# Patient Record
Sex: Male | Born: 1980 | Race: Black or African American | Hispanic: No | Marital: Single | Smoking: Never smoker
Health system: Southern US, Community
[De-identification: ages and names within clinical notes are randomized; demographics above are authoritative.]

## PROBLEM LIST (undated history)

## (undated) DIAGNOSIS — I10 Essential (primary) hypertension: Secondary | ICD-10-CM

## (undated) DIAGNOSIS — J189 Pneumonia, unspecified organism: Secondary | ICD-10-CM

## (undated) HISTORY — PX: ANKLE SURGERY: SHX546

## (undated) HISTORY — PX: NECK SURGERY: SHX720

---

## 2006-07-25 ENCOUNTER — Inpatient Hospital Stay (HOSPITAL_COMMUNITY): Admission: AC | Admit: 2006-07-25 | Discharge: 2006-08-17 | Payer: Self-pay

## 2006-08-06 ENCOUNTER — Encounter: Payer: Self-pay | Admitting: Vascular Surgery

## 2006-08-08 ENCOUNTER — Ambulatory Visit: Payer: Self-pay | Admitting: Physical Medicine & Rehabilitation

## 2006-08-17 ENCOUNTER — Inpatient Hospital Stay (HOSPITAL_COMMUNITY)
Admission: RE | Admit: 2006-08-17 | Discharge: 2006-08-31 | Payer: Self-pay | Admitting: Physical Medicine & Rehabilitation

## 2006-09-04 ENCOUNTER — Encounter
Admission: RE | Admit: 2006-09-04 | Discharge: 2006-12-03 | Payer: Self-pay | Admitting: Physical Medicine & Rehabilitation

## 2006-09-14 ENCOUNTER — Ambulatory Visit: Payer: Self-pay | Admitting: Physical Medicine & Rehabilitation

## 2006-09-14 ENCOUNTER — Encounter
Admission: RE | Admit: 2006-09-14 | Discharge: 2006-12-13 | Payer: Self-pay | Admitting: Physical Medicine & Rehabilitation

## 2006-09-18 ENCOUNTER — Encounter: Admission: RE | Admit: 2006-09-18 | Discharge: 2006-09-18 | Payer: Self-pay | Admitting: Neurological Surgery

## 2006-10-30 ENCOUNTER — Encounter: Admission: RE | Admit: 2006-10-30 | Discharge: 2006-10-30 | Payer: Self-pay | Admitting: Neurological Surgery

## 2006-12-14 ENCOUNTER — Encounter: Admission: RE | Admit: 2006-12-14 | Discharge: 2006-12-14 | Payer: Self-pay | Admitting: Neurological Surgery

## 2006-12-31 ENCOUNTER — Encounter: Admission: RE | Admit: 2006-12-31 | Discharge: 2006-12-31 | Payer: Self-pay | Admitting: Neurological Surgery

## 2007-01-23 ENCOUNTER — Ambulatory Visit (HOSPITAL_COMMUNITY): Admission: RE | Admit: 2007-01-23 | Discharge: 2007-01-24 | Payer: Self-pay | Admitting: Anesthesiology

## 2007-02-25 ENCOUNTER — Encounter: Admission: RE | Admit: 2007-02-25 | Discharge: 2007-02-25 | Payer: Self-pay | Admitting: Neurological Surgery

## 2007-04-29 ENCOUNTER — Encounter: Admission: RE | Admit: 2007-04-29 | Discharge: 2007-04-29 | Payer: Self-pay | Admitting: Neurological Surgery

## 2007-11-02 IMAGING — CT CT HEAD W/O CM
2 of 3 series · 16 of 30 positions shown, 19 images · IV contrast (agent unspecified)
Comparison: 07/31/06.

CLINICAL DATA: Closed head injury, intracranial contusions and tiny right parafalcine subdural hematoma.
HEAD CT WITHOUT CONTRAST:
TECHNIQUE: Contiguous axial images were obtained from the base of the skull through the vertex according to standard protocol without contrast.

[Series 2: head routine 4.8 h47s · axial · 0.47mm/px · z∈[-160,-15]mm · 12 of 36 slices shown, 15 images (1 of 2)]
[im 3/36  brain]
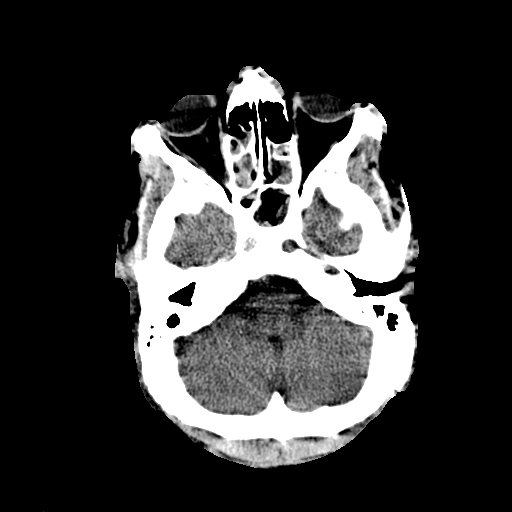
[im 3/36  bone]
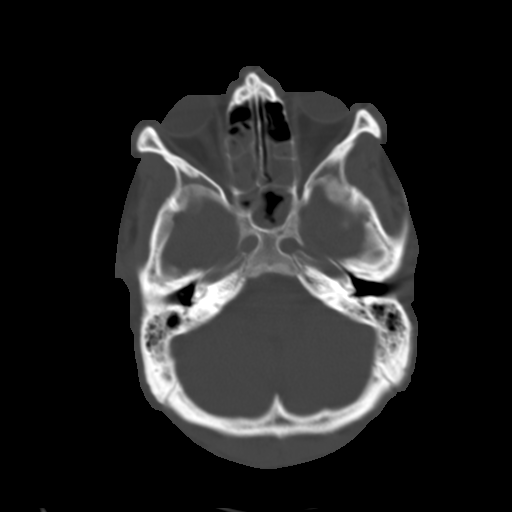
[im 6/36  brain]
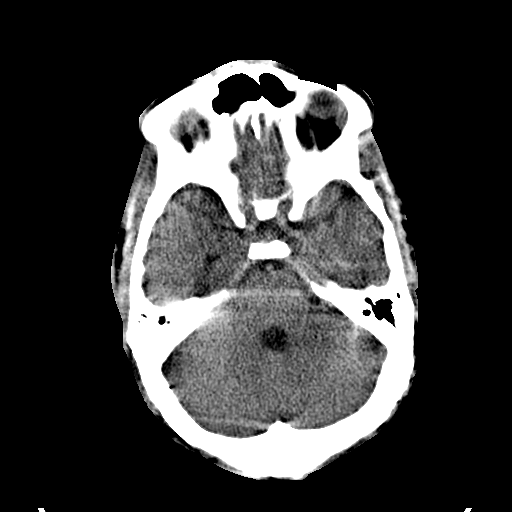
[im 9/36  brain]
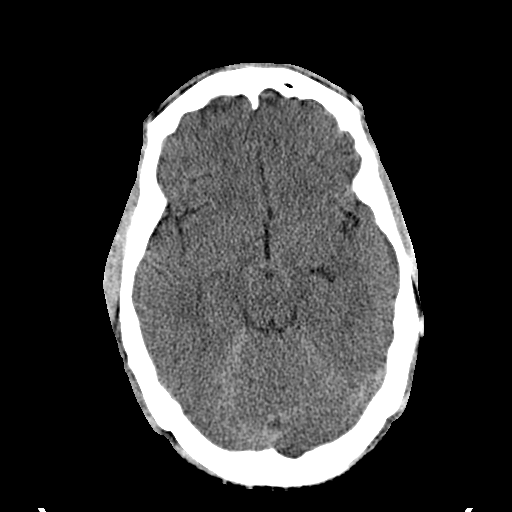
[im 11/36  brain]
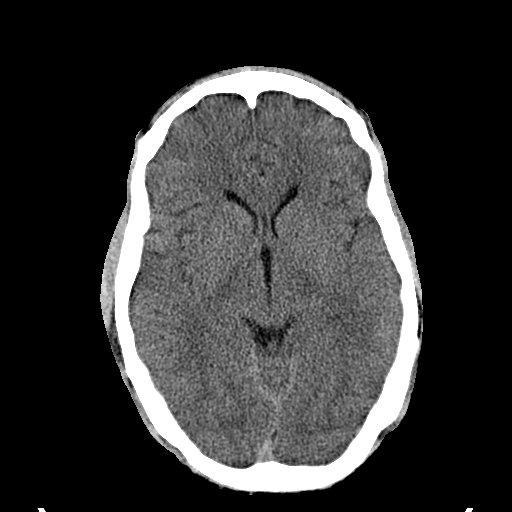
[im 14/36  brain]
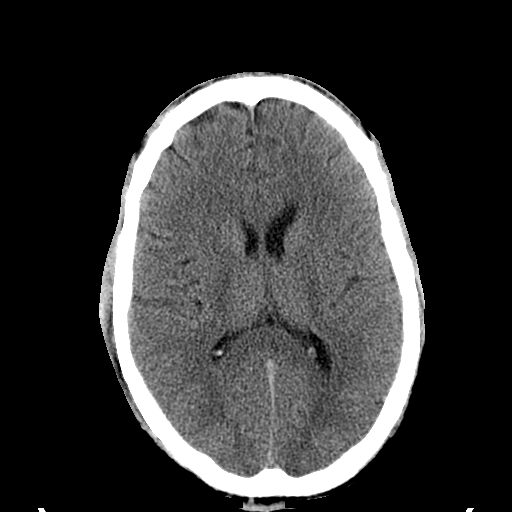
[im 14/36  bone]
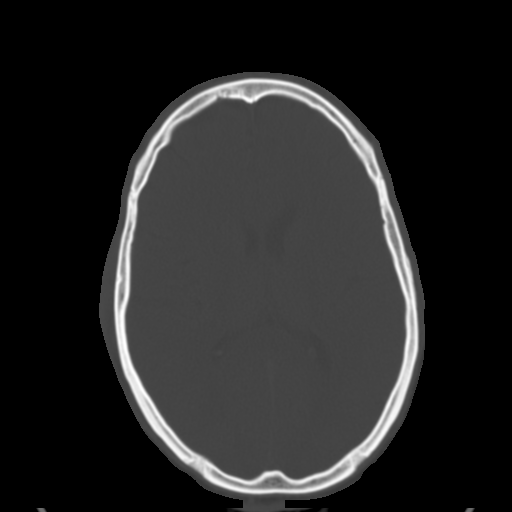
[im 17/36  brain]
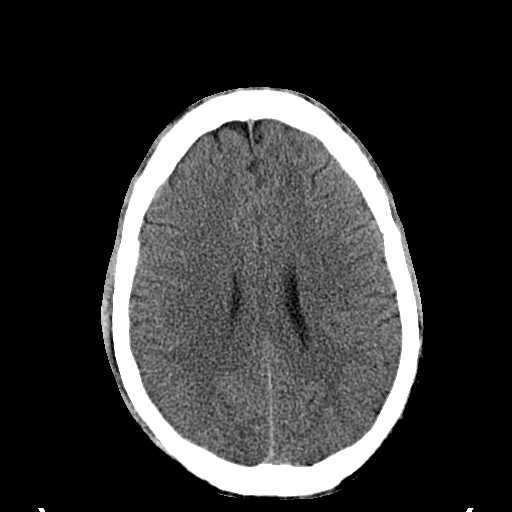
[im 19/36  brain]
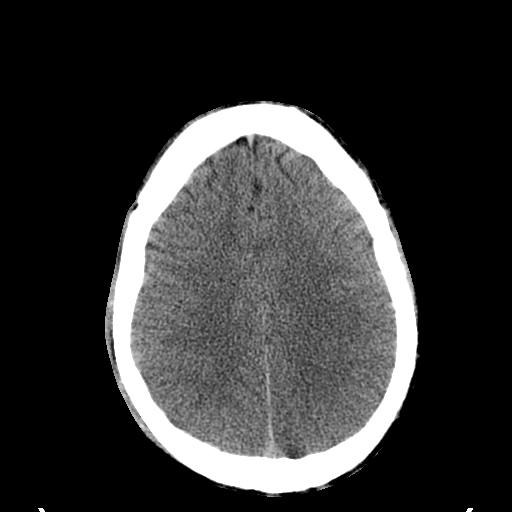
[im 22/36  brain]
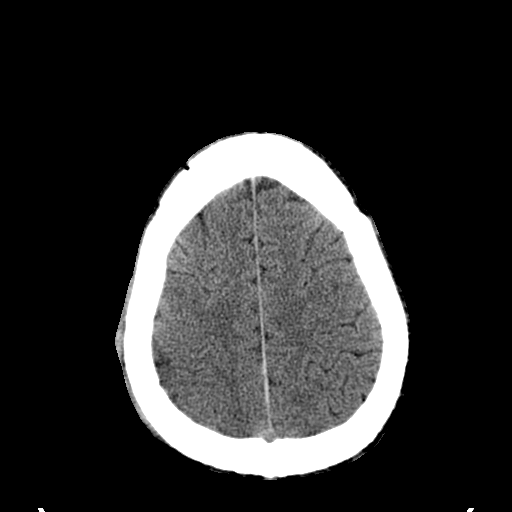
[im 25/36  brain]
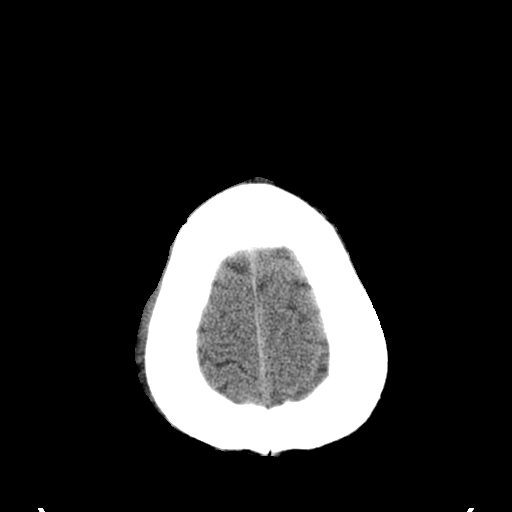
[im 25/36  bone]
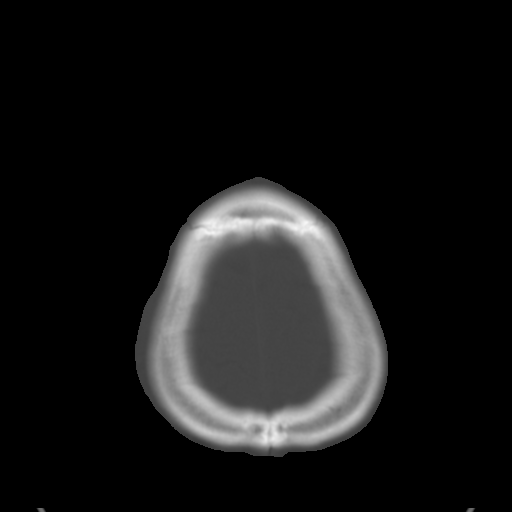
[im 27/36  brain]
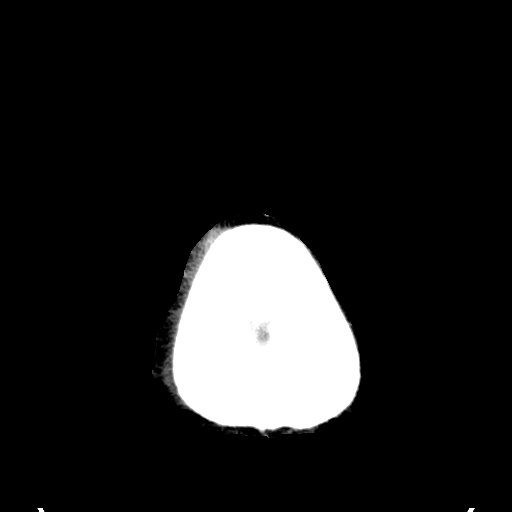
[im 30/36  brain]
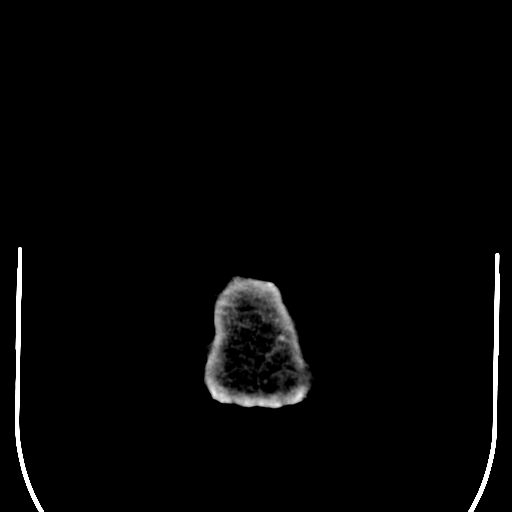
[im 33/36  brain]
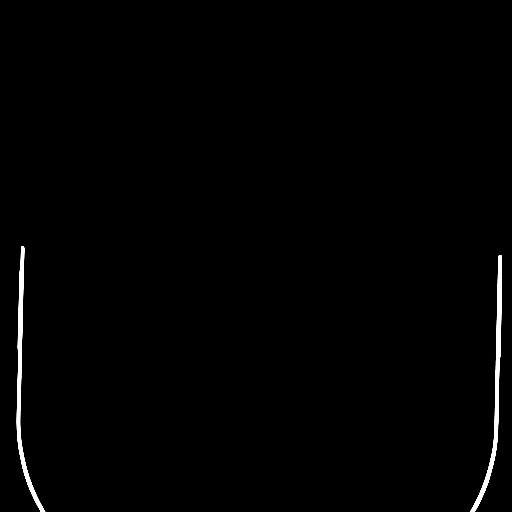

[Series 3: head routine 4.8 h47s · axial · 0.47mm/px · z∈[-176,-142]mm · 4 of 18 slices shown (2 of 2)]
[im 3/18  brain]
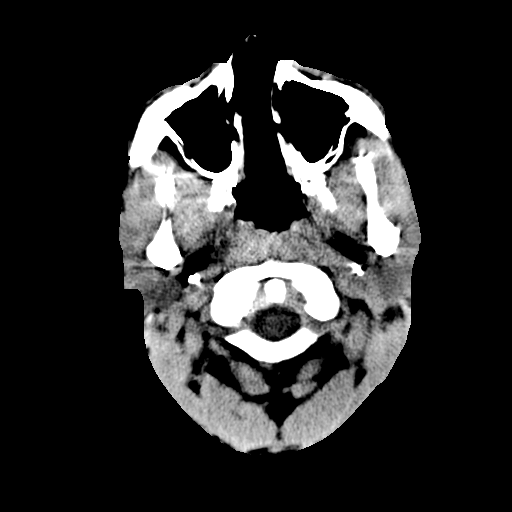
[im 5/18  brain]
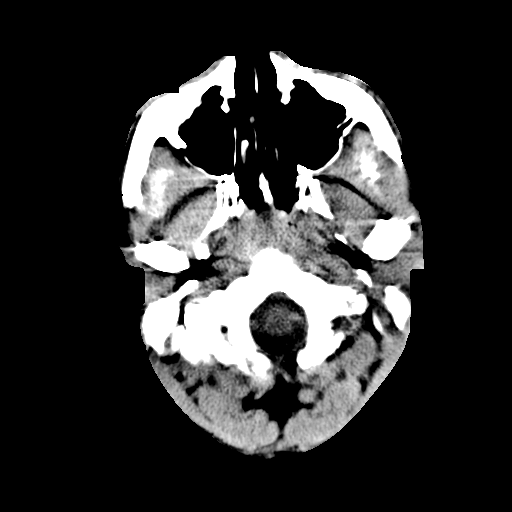
[im 8/18  brain]
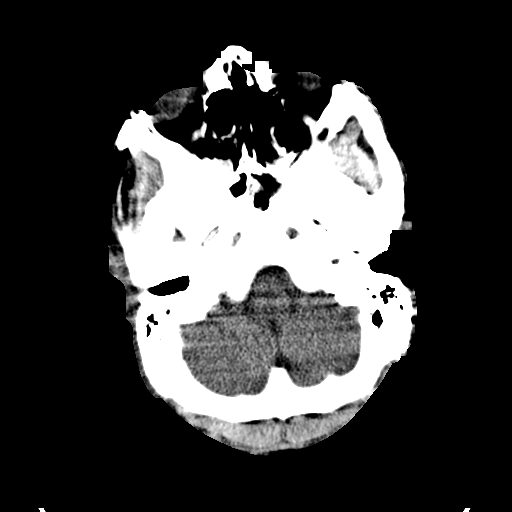
[im 10/18  brain]
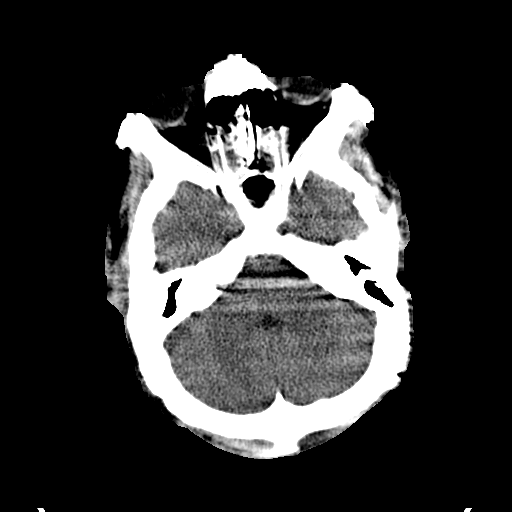

[16 of 30 positions shown; findings below may reference images not displayed]

FINDINGS: The areas of intracranial contusions/hemorrhage are now not well visualized.  tiny right parafalcine subdural hematoma is minimally decreased.  Increased visualization of the sulci suggests decreasing intracranial pressure/edema.  No new areas of hemorrhage are noted.  There is no evidence of hydrocephalus, acute infarct, or midline shift.  Ethmoid and sphenoid sinus disease is unchanged.
Intracranial pressure monitor has been removed.
IMPRESSION: 1.  No acute abnormality.
2.  Intracranial pressure monitor removed.
3.  Intracranial contusion/shear injuries now difficult to visualize.
4.  Slight improvement in tiny right parafalcine subdural hematoma.

## 2010-07-31 ENCOUNTER — Encounter: Payer: Self-pay | Admitting: Physical Medicine & Rehabilitation
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
## 2011-04-25 LAB — DIFFERENTIAL
Basophils Absolute: 0
Eosinophils Absolute: 0.3
Eosinophils Relative: 3
Lymphocytes Relative: 23
Monocytes Absolute: 0.6
Monocytes Relative: 7

## 2011-04-25 LAB — CBC
HCT: 42.6
Hemoglobin: 14.4
Platelets: 174
RDW: 14.4 — ABNORMAL HIGH
WBC: 8.2

## 2011-04-25 LAB — BASIC METABOLIC PANEL
Chloride: 105
GFR calc Af Amer: >60
Glucose, Bld: 102 — ABNORMAL HIGH
Potassium: 4.1
Sodium: 137

## 2011-04-25 LAB — PROTIME-INR: INR: 1

## 2016-11-01 ENCOUNTER — Emergency Department (HOSPITAL_BASED_OUTPATIENT_CLINIC_OR_DEPARTMENT_OTHER)
Admission: EM | Admit: 2016-11-01 | Discharge: 2016-11-01 | Disposition: A | Discharge status: Home / Self Care | Payer: Self-pay | Attending: Emergency Medicine | Admitting: Emergency Medicine

## 2016-11-01 ENCOUNTER — Encounter (HOSPITAL_BASED_OUTPATIENT_CLINIC_OR_DEPARTMENT_OTHER): Payer: Self-pay | Admitting: Emergency Medicine

## 2016-11-01 ENCOUNTER — Emergency Department (HOSPITAL_BASED_OUTPATIENT_CLINIC_OR_DEPARTMENT_OTHER): Payer: Self-pay

## 2016-11-01 DIAGNOSIS — Z79899 Other long term (current) drug therapy: Secondary | ICD-10-CM | POA: Insufficient documentation

## 2016-11-01 DIAGNOSIS — R0602 Shortness of breath: Secondary | ICD-10-CM | POA: Insufficient documentation

## 2016-11-01 DIAGNOSIS — I10 Essential (primary) hypertension: Secondary | ICD-10-CM | POA: Insufficient documentation

## 2016-11-01 DIAGNOSIS — R0789 Other chest pain: Secondary | ICD-10-CM | POA: Insufficient documentation

## 2016-11-01 DIAGNOSIS — R0609 Other forms of dyspnea: Secondary | ICD-10-CM | POA: Insufficient documentation

## 2016-11-01 HISTORY — DX: Pneumonia, unspecified organism: J18.9

## 2016-11-01 HISTORY — DX: Essential (primary) hypertension: I10

## 2016-11-01 LAB — BASIC METABOLIC PANEL
Anion gap: 9 (ref 5–15)
BUN: 11 mg/dL (ref 6–20)
CALCIUM: 9.2 mg/dL (ref 8.9–10.3)
CO2: 25 mmol/L (ref 22–32)
CREATININE: 1.13 mg/dL (ref 0.61–1.24)
Chloride: 105 mmol/L (ref 101–111)
GFR calc Af Amer: >60 mL/min (ref >60)
GLUCOSE: 101 mg/dL — AB (ref 65–99)
POTASSIUM: 3.8 mmol/L (ref 3.5–5.1)
Sodium: 139 mmol/L (ref 135–145)

## 2016-11-01 LAB — D-DIMER, QUANTITATIVE: D-Dimer, Quant: <0.27 ug/mL-FEU (ref 0.00–0.50)

## 2016-11-01 LAB — CBC WITH DIFFERENTIAL/PLATELET
BASOS ABS: 0 10*3/uL (ref 0.0–0.1)
Basophils Relative: 0 %
EOS PCT: 2 %
Eosinophils Absolute: 0.2 10*3/uL (ref 0.0–0.7)
HCT: 42 % (ref 39.0–52.0)
Hemoglobin: 14.9 g/dL (ref 13.0–17.0)
LYMPHS PCT: 23 %
Lymphs Abs: 2.7 10*3/uL (ref 0.7–4.0)
MCH: 28.8 pg (ref 26.0–34.0)
MCHC: 35.5 g/dL (ref 30.0–36.0)
MCV: 81.1 fL (ref 78.0–100.0)
MONO ABS: 1.1 10*3/uL — AB (ref 0.1–1.0)
Monocytes Relative: 9 %
Neutro Abs: 7.9 10*3/uL — ABNORMAL HIGH (ref 1.7–7.7)
Neutrophils Relative %: 66 %
PLATELETS: 238 10*3/uL (ref 150–400)
RBC: 5.18 MIL/uL (ref 4.22–5.81)
RDW: 13.9 % (ref 11.5–15.5)
WBC: 11.9 10*3/uL — ABNORMAL HIGH (ref 4.0–10.5)

## 2016-11-01 LAB — TROPONIN I: Troponin I: <0.03 ng/mL (ref <0.03)

## 2016-11-01 MED ORDER — IBUPROFEN 800 MG PO TABS: 800.0000 mg | ORAL_TABLET | Freq: Three times a day (TID) | ORAL | 0 refills | Status: AC | PRN

## 2016-11-01 MED FILL — IBUPROFEN 800 MG TABLET: 800 | 7 days supply | Qty: 21 | Fill #0

## 2016-11-01 NOTE — ED Notes (Signed)
Patient transported to X-ray 

## 2016-11-01 NOTE — ED Provider Notes (Signed)
Emergency Department Provider Note   I have reviewed the triage vital signs and the nursing notes.   HISTORY  Chief Complaint Rib Pain   HPI [name] Mcintyre is a 36 y.o. male with PMH of HTN and PNA presents to the emergency department for evaluation of left sided, posterior chest wall pain. Pain started yesterday. Patient states he has some associated dyspnea that is worse with deep breathing. He states it feels like "nails grabbing" him when he breathes deeply. He denies any fever or chills. States he had a similar pain with a pneumonia many years ago. He denies any cough, fever, chills. No chest pain at rest. No exertional component to pain. No injury to the chest wall. Pain not worse with palpation.   Past Medical History:  Diagnosis Date  . Hypertension   . Pneumonia     There are no active problems to display for this patient.   Past Surgical History:  Procedure Laterality Date  . ANKLE SURGERY Left   . NECK SURGERY      Current Outpatient Rx  . Order #: [id number] Class: Historical Med  . Order #: [id number] Class: Print    Allergies Shellfish allergy  History reviewed. No pertinent family history.  Social History Social History  Substance Use Topics  . Smoking status: Never Smoker  . Smokeless tobacco: Never Used  . Alcohol use No    Review of Systems  Constitutional: No fever/chills Eyes: No visual changes. ENT: No sore throat. Cardiovascular: Positive chest pain. Respiratory: Positive shortness of breath. Gastrointestinal: No abdominal pain.  No nausea, no vomiting.  No diarrhea.  No constipation. Genitourinary: Negative for dysuria. Musculoskeletal: Negative for back pain. Skin: Negative for rash. Neurological: Negative for headaches, focal weakness or numbness.  10-point ROS otherwise negative.  ____________________________________________   PHYSICAL EXAM:  VITAL SIGNS: ED Triage Vitals  Enc Vitals Group     BP 11/01/16 0802 (!)  159/118     Pulse Rate 11/01/16 0802 72     Resp 11/01/16 0802 18     Temp 11/01/16 0802 99.3 F (37.4 C)     Temp Source 11/01/16 0802 Oral     SpO2 11/01/16 0802 100 %     Weight 11/01/16 0802 250 lb (113.4 kg)     Height 11/01/16 0802  (1.93 m)     Pain Score 11/01/16 0807 10   Constitutional: Alert and oriented. Well appearing and in no acute distress. Eyes: Conjunctivae are normal.  Head: Atraumatic. Nose: No congestion/rhinnorhea. Mouth/Throat: Mucous membranes are moist.  Oropharynx non-erythematous. Neck: No stridor. Cardiovascular: Normal rate, regular rhythm. Good peripheral circulation. Grossly normal heart sounds.   Respiratory: Normal respiratory effort.  No retractions. Lungs CTAB. Gastrointestinal: Soft and nontender. No distention.  Musculoskeletal: No lower extremity tenderness nor edema. No gross deformities of extremities. No tenderness to palpation of the chest wall. No bruising or deformity.  Neurologic:  Normal speech and language. No gross focal neurologic deficits are appreciated.  Skin:  Skin is warm, dry and intact. No rash noted. Psychiatric: Mood and affect are normal. Speech and behavior are normal.  ____________________________________________   LABS (all labs ordered are listed, but only abnormal results are displayed)  Labs Reviewed  BASIC METABOLIC PANEL - Abnormal; Notable for the following:       Result Value   Glucose, Bld 101 (*)    All other components within normal limits  CBC WITH DIFFERENTIAL/PLATELET - Abnormal; Notable for the following:    WBC  11.9 (*)    Neutro Abs 7.9 (*)    Monocytes Absolute 1.1 (*)    All other components within normal limits  D-DIMER, QUANTITATIVE (NOT AT [facility])  TROPONIN I   ____________________________________________  EKG   EKG Interpretation  Date/Time:  Wednesday November 01 2016 08:38:34 EDT Ventricular Rate:  61 PR Interval:    QRS Duration: 91 QT Interval:  373 QTC Calculation: 376 R  Axis:   66 Text Interpretation:  Sinus rhythm Consider left atrial enlargement ST elev, probable normal early repol pattern Baseline wander in lead(s) I III aVL No STEMI.  Similar to prior.  Confirmed by Jaiquan Temme MD, Alain Deschene [phone]) on 11/01/2016 8:43:09 AM       ____________________________________________  RADIOLOGY  Dg Chest 2 View  Result Date: 11/01/2016 CLINICAL DATA:  36 year old presenting with left upper chest pain with inspiration that began yesterday. Prior history of left lung pneumonia at which time patient states he had similar symptoms. EXAM: CHEST  2 VIEW COMPARISON:  11/26/2015, 01/18/2007 and earlier. FINDINGS: Cardiomediastinal silhouette unremarkable. Lungs clear. Bronchovascular markings normal. Pulmonary vascularity normal. No pneumothorax. No pleural effusions. Visualized bony thorax intact. IMPRESSION: Normal examination. Electronically Signed   By: Hulan Saas M.D.   On: 11/01/2016 08:33    ____________________________________________   PROCEDURES  Procedure(s) performed:   Procedures  None ____________________________________________   INITIAL IMPRESSION / ASSESSMENT AND PLAN / ED COURSE  Pertinent labs & imaging results that were available during my care of the patient were reviewed by me and considered in my medical decision making (see chart for details).  Patient resents to the emergency department for evaluation of chest wall pain and difficult to breathing. Pain is pleuritic and began without injury. Patient reports a history of pneumonia which felt similar but has no other signs or symptoms of pneumonia at this time. Plan for plain film along with EKG and lab work. Patient is otherwise low risk for pulmonary embolism so will follow with d-dimer. No hypoxia or significant distress at this time.   EKG, d-dimer, and remaining labs are unremarkable. Suspect MSK etiology. Plan for PCP follow up and NSAIDs for pain.   At this time, I do not feel there is  any life-threatening condition present. I have reviewed and discussed all results (EKG, imaging, lab, urine as appropriate), exam findings with patient. I have reviewed nursing notes and appropriate previous records.  I feel the patient is safe to be discharged home without further emergent workup. Discussed usual and customary return precautions. Patient and family (if present) verbalize understanding and are comfortable with this plan.  Patient will follow-up with their primary care provider. If they do not have a primary care provider, information for follow-up has been provided to them. All questions have been answered.  ____________________________________________  FINAL CLINICAL IMPRESSION(S) / ED DIAGNOSES  Final diagnoses:  Chest wall pain  Other form of dyspnea     MEDICATIONS GIVEN DURING THIS VISIT:  None  NEW OUTPATIENT MEDICATIONS STARTED DURING THIS VISIT:  New Prescriptions   IBUPROFEN (ADVIL,MOTRIN) 800 MG TABLET    Take 1 tablet (800 mg total) by mouth every 8 (eight) hours as needed.     Note:  This document was prepared using Dragon voice recognition software and may include unintentional dictation errors.  Alona Bene, MD Emergency Medicine   Maia Plan, MD 11/01/16 [phone]

## 2016-11-01 NOTE — ED Triage Notes (Signed)
Left rib pain with a deep breath.  Denies injury

## 2016-11-01 NOTE — ED Notes (Signed)
ED Provider at bedside. 

## 2016-11-01 NOTE — Discharge Instructions (Signed)

## 2019-08-03 ENCOUNTER — Emergency Department (HOSPITAL_BASED_OUTPATIENT_CLINIC_OR_DEPARTMENT_OTHER)
Admission: EM | Admit: 2019-08-03 | Discharge: 2019-08-04 | Disposition: A | Discharge status: Home / Self Care | Payer: Self-pay | Attending: Emergency Medicine | Admitting: Emergency Medicine

## 2019-08-03 ENCOUNTER — Other Ambulatory Visit: Payer: Self-pay

## 2019-08-03 ENCOUNTER — Encounter (HOSPITAL_BASED_OUTPATIENT_CLINIC_OR_DEPARTMENT_OTHER): Payer: Self-pay | Admitting: *Deleted

## 2019-08-03 DIAGNOSIS — H6123 Impacted cerumen, bilateral: Secondary | ICD-10-CM | POA: Insufficient documentation

## 2019-08-03 DIAGNOSIS — I10 Essential (primary) hypertension: Secondary | ICD-10-CM | POA: Insufficient documentation

## 2019-08-03 DIAGNOSIS — Z79899 Other long term (current) drug therapy: Secondary | ICD-10-CM | POA: Insufficient documentation

## 2019-08-03 DIAGNOSIS — H9201 Otalgia, right ear: Secondary | ICD-10-CM | POA: Insufficient documentation

## 2019-08-03 NOTE — ED Notes (Signed)
ED Provider at bedside. 

## 2019-08-03 NOTE — ED Provider Notes (Signed)
[facility]   CSN: [id number] Arrival date & time: 08/03/19  2247     History Chief Complaint  Patient presents with  . Otalgia    [name] Mcintyre is a 39 y.o. male.  HPI     This a 40 year old male with a history of hypertension who presents with right-sided ear pain.  Patient reports 2 to 3-day history of increasing right-sided ear pain.  He also reports decreased hearing in both ears.  Denies any upper respiratory symptoms including cough, congestion, fevers.  He rates his pain at 6 out of 10.  He tried to rinse out his ear with the over-the-counter earwax kit.  He does not believe he was successful.  Past Medical History:  Diagnosis Date  . Hypertension   . Pneumonia     There are no problems to display for this patient.   Past Surgical History:  Procedure Laterality Date  . ANKLE SURGERY Left   . NECK SURGERY         No family history on file.  Social History   Tobacco Use  . Smoking status: Never Smoker  . Smokeless tobacco: Never Used  Substance Use Topics  . Alcohol use: Yes    Comment: occasional  . Drug use: Yes    Types: Marijuana    Home Medications Prior to Admission medications   Medication Sig Start Date End Date Taking? Authorizing Provider  amLODipine (NORVASC) 5 MG tablet Take 5 mg by mouth daily.   Yes [provider]  ibuprofen (ADVIL,MOTRIN) 800 MG tablet Take 1 tablet (800 mg total) by mouth every 8 (eight) hours as needed. 11/01/16   Long, Wonda Olds, MD    Allergies    Shellfish allergy  Review of Systems   Review of Systems  Constitutional: Negative for fever.  HENT: Positive for ear pain. Negative for congestion and ear discharge.   Respiratory: Negative for cough.   All other systems reviewed and are negative.   Physical Exam Updated Vital Signs BP (!) 171/123 (BP Location: Right Arm)   Pulse 73   Temp 98.1 F (36.7 C) (Oral)   Resp 16   Ht 1.93 m ('6\' 4"'$ )   Wt  122.5 kg   SpO2 99%   BMI 32.87 kg/m   Physical Exam Vitals and nursing note reviewed.  Constitutional:      Appearance: He is well-developed. He is not ill-appearing.  HENT:     Head: Normocephalic and atraumatic.     Ears:     Comments: Bilateral cerumen impaction, completely obscuring TM After removal, bilateral TMs clear, no evidence of perforation, slight erythema    Mouth/Throat:     Mouth: Mucous membranes are moist.  Eyes:     Pupils: Pupils are equal, round, and reactive to light.  Cardiovascular:     Rate and Rhythm: Normal rate and regular rhythm.  Pulmonary:     Effort: Pulmonary effort is normal. No respiratory distress.  Musculoskeletal:     Cervical back: Neck supple.  Lymphadenopathy:     Cervical: Cervical adenopathy present.  Skin:    General: Skin is warm and dry.  Neurological:     Mental Status: He is alert and oriented to person, place, and time.  Psychiatric:        Mood and Affect: Mood normal.     ED Results / Procedures / Treatments   Labs (all labs ordered are listed, but only abnormal results are displayed) Labs  Reviewed - No data to display  EKG None  Radiology No results found.  Procedures Procedures (including critical care time)  Medications Ordered in ED Medications - No data to display  ED Course  I have reviewed the triage vital signs and the nursing notes.  Pertinent labs & imaging results that were available during my care of the patient were reviewed by me and considered in my medical decision making (see chart for details).    MDM Rules/Calculators/A&P                       Patient presents with right ear pain.  Evidence of bilateral cerumen impaction.  Ears were flushed by nursing with extraction of large bulbs of wax.  Repeat examination shows intact TM without perforation.  Patient reports improved hearing and diminished pain.  After history, exam, and medical workup I feel the patient has been appropriately  medically screened and is safe for discharge home. Pertinent diagnoses were discussed with the patient. Patient was given return precautions.   Final Clinical Impression(s) / ED Diagnoses Final diagnoses:  Right ear pain  Bilateral impacted cerumen    Rx / DC Orders ED Discharge Orders    None       Merryl Hacker, MD 08/03/19 [id number]

## 2019-08-03 NOTE — ED Triage Notes (Signed)
Pt reports right ear pain, decreased hearing, and wax buildup x 3 days. He tried home wax removal kit without relief 

## 2023-10-18 ENCOUNTER — Emergency Department (HOSPITAL_COMMUNITY)

## 2023-10-18 ENCOUNTER — Other Ambulatory Visit: Payer: Self-pay

## 2023-10-18 ENCOUNTER — Emergency Department (HOSPITAL_COMMUNITY)
Admission: EM | Admit: 2023-10-18 | Discharge: 2023-10-18 | Disposition: A | Discharge status: Home / Self Care | Attending: Emergency Medicine | Admitting: Emergency Medicine

## 2023-10-18 DIAGNOSIS — Z79899 Other long term (current) drug therapy: Secondary | ICD-10-CM | POA: Insufficient documentation

## 2023-10-18 DIAGNOSIS — Z7901 Long term (current) use of anticoagulants: Secondary | ICD-10-CM | POA: Diagnosis not present

## 2023-10-18 DIAGNOSIS — I824Y2 Acute embolism and thrombosis of unspecified deep veins of left proximal lower extremity: Secondary | ICD-10-CM | POA: Diagnosis not present

## 2023-10-18 DIAGNOSIS — M7989 Other specified soft tissue disorders: Secondary | ICD-10-CM | POA: Diagnosis present

## 2023-10-18 MED ORDER — APIXABAN (ELIQUIS) VTE STARTER PACK (10MG AND 5MG): ORAL_TABLET | ORAL | 0 refills | Status: AC

## 2023-10-18 MED ORDER — APIXABAN (ELIQUIS) VTE STARTER PACK (10MG AND 5MG): ORAL_TABLET | ORAL | 0 refills | Status: DC

## 2023-10-18 NOTE — ED Triage Notes (Signed)
 Pt arrived from [place] [facility] with c/o L leg swelling x 1 week, rates pain 0/10 when sitting and 10/10 when ambulating. Pt thinks he may have a blood clot in his leg. Swelling noted to L leg in triage

## 2023-10-18 NOTE — Discharge Instructions (Signed)
 As we discussed, you have a DVT in the left leg. This is why your leg is painful and swollen.  I have sent a prescription to the pharmacy.  It is very important that you start taking this and take as prescribed.  Is also very important you do not miss any doses.  I have also given you follow-up with  primary care.  You can call and schedule an appointment.  Please return to the emergency department for any worsening symptoms.

## 2023-10-18 NOTE — ED Provider Notes (Signed)
 [place] EMERGENCY DEPARTMENT AT [facility] Provider Note   CSN: [id number] Arrival date & time: 10/18/23  1110     History Chief Complaint  Patient presents with   Leg Swelling    [name] Mcintyre is a 43 y.o. male patient who presents to the emergency department today for further evaluation of left lower extremity swelling.  It has been swollen and painful for the last week.  Pain is worse with walking.  Better with rest.  Denies any fever or chills.  Also denies any trauma.  HPI     Home Medications Prior to Admission medications   Medication Sig Start Date End Date Taking? Authorizing Provider  APIXABAN (ELIQUIS) VTE STARTER PACK (10MG  AND 5MG ) Take as directed on package: start with two-5mg  tablets twice daily for 7 days. On day 8, switch to one-5mg  tablet twice daily. 10/18/23  Yes Meredeth Ide, Johnnye Sandford M, PA-C  amLODipine (NORVASC) 5 MG tablet Take 5 mg by mouth daily.    [provider]  ibuprofen (ADVIL,MOTRIN) 800 MG tablet Take 1 tablet (800 mg total) by mouth every 8 (eight) hours as needed. 11/01/16   Long, Arlyss Repress, MD      Allergies    Shellfish allergy    Review of Systems   Review of Systems  All other systems reviewed and are negative.   Physical Exam Updated Vital Signs BP 123/80   Pulse 66   Temp 98.7 F (37.1 C) (Oral)   Resp 20   SpO2 97%  Physical Exam Vitals and nursing note reviewed.  Constitutional:      Appearance: Normal appearance.  HENT:     Head: Normocephalic and atraumatic.  Eyes:     General:        Right eye: No discharge.        Left eye: No discharge.     Conjunctiva/sclera: Conjunctivae normal.  Pulmonary:     Effort: Pulmonary effort is normal.  Skin:    General: Skin is warm and dry.     Findings: No rash.     Comments: Left lower extremity is certainly swollen in comparison to the right.  It is tender to palpation and slightly warm.  No obvious erythema.  No open wounds.  2+ posterior tibialis pulse felt  in the left and right.  These are equal.  Neurological:     General: No focal deficit present.     Mental Status: He is alert.  Psychiatric:        Mood and Affect: Mood normal.        Behavior: Behavior normal.     ED Results / Procedures / Treatments   Labs (all labs ordered are listed, but only abnormal results are displayed) Labs Reviewed - No data to display  EKG None  Radiology US Venous Img Lower Unilateral Left Result Date: 10/18/2023 CLINICAL DATA:  Left lower extremity edema. EXAM: LEFT LOWER EXTREMITY VENOUS DOPPLER ULTRASOUND TECHNIQUE: Gray-scale sonography with graded compression, as well as color Doppler and duplex ultrasound were performed to evaluate the lower extremity deep venous systems from the level of the common femoral vein and including the common femoral, femoral, profunda femoral, popliteal and calf veins including the posterior tibial, peroneal and gastrocnemius veins when visible. The superficial great saphenous vein was also interrogated. Spectral Doppler was utilized to evaluate flow at rest and with distal augmentation maneuvers in the common femoral, femoral and popliteal veins. COMPARISON:  None Available. FINDINGS: Contralateral Common Femoral Vein: Respiratory  phasicity is normal and symmetric with the symptomatic side. No evidence of thrombus. Normal compressibility. Common Femoral Vein: No evidence of thrombus. Normal compressibility, respiratory phasicity and response to augmentation. Saphenofemoral Junction: No evidence of thrombus. Normal compressibility and flow on color Doppler imaging. Profunda Femoral Vein: No evidence of thrombus. Normal compressibility and flow on color Doppler imaging. Femoral Vein: Nonocclusive thrombus in the proximal left femoral vein. Occlusive thrombus in the mid to distal left femoral vein. Popliteal Vein: Occlusive thrombus in the left popliteal vein. Calf Veins: Very limited evaluation calf veins. Superficial Great Saphenous  Vein: No evidence of thrombus. Normal compressibility. Venous Reflux:  None. Other Findings: No evidence of superficial thrombophlebitis or abnormal fluid collection. IMPRESSION: Occlusive thrombus in the mid to distal left femoral vein and occlusive thrombus in the left popliteal vein. Nonocclusive thrombus in the proximal left femoral vein. Electronically Signed   By: Irish Lack M.D.   On: 10/18/2023 14:56    Procedures Procedures   Medications Ordered in ED Medications - No data to display  ED Course/ Medical Decision Making/ A&P Clinical Course as of 10/18/23 1508  Thu Oct 18, 2023  1507 US Venous Img Lower Unilateral Left I personally ordered and interpreted the study.  There is evidence of DVT in the left lower extremity.  On physical exam there is no evidence of phlegmasia alba dolens or phlegmasia cerulea dolens. [CF]    Clinical Course User Index [CF] Teressa Lower, PA-C   {   Click here for ABCD2, HEART and other calculators  Medical Decision Making [name] Mcintyre is a 43 y.o. male patient who presents to the emergency department today for further evaluation of left lower extremity pain and swelling.  Will likely get an ultrasound to rule out DVT.  Patient is not having any clinical signs of pulmonary embolism at this time.  No chest pain or shortness of breath.  Vital signs are normal.  Patient is not tachycardic.  Oxygenating well on room air.  There is evidence of DVT in the left lower extremity as on ultrasound.  Will start the patient on Eliquis starter pack.  Sent to the pharmacy.  He is hemodynamically stable and ambulatory.  Will have him follow-up with primary care.  I have given him a referral.  Strict return precautions were discussed.  He is safe for discharge.   Risk Prescription drug management.    Final Clinical Impression(s) / ED Diagnoses Final diagnoses:  Acute deep vein thrombosis (DVT) of proximal vein of left lower extremity (HCC)    Rx /  DC Orders ED Discharge Orders          Ordered    APIXABAN (ELIQUIS) VTE STARTER PACK (10MG  AND 5MG )       Note to Pharmacy: If starter pack unavailable, substitute with seventy-four 5 mg apixaban tabs following the above SIG directions.   10/18/23 1503              Teressa Lower, PA-C 10/18/23 1508    Terrilee Files, MD 10/18/23 [phone]

## 2023-10-26 ENCOUNTER — Emergency Department (HOSPITAL_COMMUNITY)
Admission: EM | Admit: 2023-10-26 | Discharge: 2023-10-26 | Disposition: A | Discharge status: Home / Self Care | Attending: Emergency Medicine | Admitting: Emergency Medicine

## 2023-10-26 ENCOUNTER — Emergency Department (HOSPITAL_COMMUNITY)

## 2023-10-26 ENCOUNTER — Other Ambulatory Visit: Payer: Self-pay

## 2023-10-26 ENCOUNTER — Encounter (HOSPITAL_COMMUNITY): Payer: Self-pay | Admitting: Emergency Medicine

## 2023-10-26 DIAGNOSIS — M79605 Pain in left leg: Secondary | ICD-10-CM | POA: Diagnosis present

## 2023-10-26 DIAGNOSIS — I82432 Acute embolism and thrombosis of left popliteal vein: Secondary | ICD-10-CM | POA: Insufficient documentation

## 2023-10-26 DIAGNOSIS — I82412 Acute embolism and thrombosis of left femoral vein: Secondary | ICD-10-CM | POA: Insufficient documentation

## 2023-10-26 DIAGNOSIS — Z7901 Long term (current) use of anticoagulants: Secondary | ICD-10-CM | POA: Diagnosis not present

## 2023-10-26 DIAGNOSIS — I82442 Acute embolism and thrombosis of left tibial vein: Secondary | ICD-10-CM | POA: Diagnosis not present

## 2023-10-26 DIAGNOSIS — O2230 Deep phlebothrombosis in pregnancy, unspecified trimester: Secondary | ICD-10-CM

## 2023-10-26 NOTE — ED Triage Notes (Signed)
 Pt bib [facility] for pain to left leg. Dx w/ blood clot in that leg. Pt is on thinners. Swelling noted to both legs.

## 2023-10-26 NOTE — ED Notes (Signed)
 Ultrasound at bedside

## 2023-10-26 NOTE — Discharge Instructions (Signed)
 Follow up with Dr. Edgardo Goodwill or one of his partners in 2-3 weeks.   Keep leg elevated above your heart as much as possible

## 2023-10-30 NOTE — ED Provider Notes (Signed)
 [place] EMERGENCY DEPARTMENT AT [facility] Provider Note   CSN: [id number] Arrival date & time: 10/26/23  2130     History  Chief Complaint  Patient presents with   Leg Pain    [name] Mcintyre is a 43 y.o. male.  Pt has a dvt in the left leg.  He has been on eliquis  for a few days.  He complains of worsening swelling  The history is provided by the patient and medical records. No language interpreter was used.  Leg Pain Location:  Leg Injury: no   Leg location:  L leg Pain details:    Quality:  Aching   Radiates to:  Does not radiate   Severity:  Moderate   Onset quality:  Sudden   Timing:  Constant   Progression:  Worsening Chronicity:  Recurrent Dislocation: no   Associated symptoms: no back pain and no fatigue        Home Medications Prior to Admission medications   Medication Sig Start Date End Date Taking? Authorizing Provider  amLODipine (NORVASC) 5 MG tablet Take 5 mg by mouth daily.    [provider]  APIXABAN  (ELIQUIS ) VTE STARTER PACK (10MG  AND 5MG ) Take as directed on package: start with two-5mg  tablets twice daily for 7 days. On day 8, switch to one-5mg  tablet twice daily. 10/18/23   Angelyn Kennel M, PA-C  ELIQUIS  5 MG TABS tablet Take 10 mg by mouth 2 (two) times daily. 10/18/23   [provider]  ibuprofen  (ADVIL ,MOTRIN ) 800 MG tablet Take 1 tablet (800 mg total) by mouth every 8 (eight) hours as needed. 11/01/16   Long, Joshua G, MD      Allergies    Shellfish allergy    Review of Systems   Review of Systems  Constitutional:  Negative for appetite change and fatigue.  HENT:  Negative for congestion, ear discharge and sinus pressure.   Eyes:  Negative for discharge.  Respiratory:  Negative for cough.   Cardiovascular:  Negative for chest pain.  Gastrointestinal:  Negative for abdominal pain and diarrhea.  Genitourinary:  Negative for frequency and hematuria.  Musculoskeletal:  Negative for back pain.       Left  leg pain  Skin:  Negative for rash.  Neurological:  Negative for seizures and headaches.  Psychiatric/Behavioral:  Negative for hallucinations.     Physical Exam Updated Vital Signs BP 103/66   Pulse 66   Temp 98 F (36.7 C) (Oral)   Resp 18   Ht 6\' 4"  (1.93 m)   Wt 122.5 kg   SpO2 99%   BMI 32.87 kg/m  Physical Exam Vitals and nursing note reviewed.  Constitutional:      Appearance: He is well-developed.  HENT:     Head: Normocephalic.     Mouth/Throat:     Mouth: Mucous membranes are moist.  Eyes:     General: No scleral icterus.    Conjunctiva/sclera: Conjunctivae normal.  Neck:     Thyroid: No thyromegaly.  Cardiovascular:     Rate and Rhythm: Normal rate and regular rhythm.     Heart sounds: No murmur heard.    No friction rub. No gallop.  Pulmonary:     Breath sounds: No stridor. No wheezing or rales.  Chest:     Chest wall: No tenderness.  Abdominal:     General: There is no distension.     Tenderness: There is no abdominal tenderness. There is no rebound.  Musculoskeletal:  Cervical back: Neck supple.     Comments: Swollen left lower leg.  Nv normal  Lymphadenopathy:     Cervical: No cervical adenopathy.  Skin:    Findings: No erythema or rash.  Neurological:     Mental Status: He is alert and oriented to person, place, and time.     Motor: No abnormal muscle tone.     Coordination: Coordination normal.  Psychiatric:        Behavior: Behavior normal.     ED Results / Procedures / Treatments   Labs (all labs ordered are listed, but only abnormal results are displayed) Labs Reviewed - No data to display  EKG None  Radiology No results found.  Procedures Procedures    Medications Ordered in ED Medications - No data to display  ED Course/ Medical Decision Making/ A&P                                 Medical Decision Making  Pt with dvt.  He will continue eliquis  and is referred to vascular surgeon        Final Clinical  Impression(s) / ED Diagnoses Final diagnoses:  DVT (deep vein thrombosis) in pregnancy    Rx / DC Orders ED Discharge Orders     None         Cheyenne Cotta, MD 10/30/23 [phone]
